# Patient Record
Sex: Female | Born: 1980 | Race: White | Hispanic: No | Marital: Married | Smoking: Never smoker
Health system: Southern US, Community
[De-identification: ages and names within clinical notes are randomized; demographics above are authoritative.]

---
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
## 2002-06-04 ENCOUNTER — Other Ambulatory Visit: Admission: RE | Admit: 2002-06-04 | Discharge: 2002-06-04 | Payer: Self-pay | Admitting: Obstetrics and Gynecology

## 2003-03-06 ENCOUNTER — Other Ambulatory Visit: Admission: RE | Admit: 2003-03-06 | Discharge: 2003-03-06 | Payer: Self-pay | Admitting: Obstetrics and Gynecology

## 2004-03-09 ENCOUNTER — Other Ambulatory Visit: Admission: RE | Admit: 2004-03-09 | Discharge: 2004-03-09 | Payer: Self-pay | Admitting: Obstetrics and Gynecology

## 2005-07-19 ENCOUNTER — Other Ambulatory Visit: Admission: RE | Admit: 2005-07-19 | Discharge: 2005-07-19 | Payer: Self-pay | Admitting: Obstetrics and Gynecology

## 2009-08-12 ENCOUNTER — Inpatient Hospital Stay (HOSPITAL_COMMUNITY): Admission: AD | Admit: 2009-08-12 | Discharge: 2009-08-14 | Payer: Self-pay | Admitting: Obstetrics and Gynecology

## 2010-10-01 LAB — CBC
HCT: 32.8 % — ABNORMAL LOW (ref 36.0–46.0)
HCT: 39.6 % (ref 36.0–46.0)
Hemoglobin: 11.2 g/dL — ABNORMAL LOW (ref 12.0–15.0)
MCHC: 34.3 g/dL (ref 30.0–36.0)
MCV: 94.6 fL (ref 78.0–100.0)
RBC: 4.19 MIL/uL (ref 3.87–5.11)
RDW: 12.8 % (ref 11.5–15.5)
WBC: 10.8 10*3/uL — ABNORMAL HIGH (ref 4.0–10.5)

## 2011-02-26 ENCOUNTER — Ambulatory Visit
Admission: RE | Admit: 2011-02-26 | Discharge: 2011-02-26 | Disposition: A | Discharge status: Home / Self Care | Payer: BC Managed Care – PPO | Source: Ambulatory Visit | Attending: Rheumatology | Admitting: Rheumatology

## 2011-02-26 ENCOUNTER — Other Ambulatory Visit: Payer: Self-pay | Admitting: Rheumatology

## 2011-02-26 DIAGNOSIS — M25561 Pain in right knee: Secondary | ICD-10-CM

## 2012-02-08 ENCOUNTER — Ambulatory Visit (INDEPENDENT_AMBULATORY_CARE_PROVIDER_SITE_OTHER): Payer: BC Managed Care – PPO | Admitting: Internal Medicine

## 2012-02-08 VITALS — BP 132/88 | HR 125 | Temp 99.3°F | Resp 20 | Ht 65.0 in | Wt 142.4 lb

## 2012-02-08 DIAGNOSIS — R21 Rash and other nonspecific skin eruption: Secondary | ICD-10-CM

## 2012-02-08 DIAGNOSIS — R112 Nausea with vomiting, unspecified: Secondary | ICD-10-CM

## 2012-02-08 DIAGNOSIS — R509 Fever, unspecified: Secondary | ICD-10-CM

## 2012-02-08 LAB — POCT UA - MICROSCOPIC ONLY
Bacteria, U Microscopic: NEGATIVE
Casts, Ur, LPF, POC: NEGATIVE
Crystals, Ur, HPF, POC: NEGATIVE
Yeast, UA: NEGATIVE

## 2012-02-08 LAB — POCT URINALYSIS DIPSTICK
Bilirubin, UA: NEGATIVE
Blood, UA: NEGATIVE
Glucose, UA: NEGATIVE
Ketones, UA: NEGATIVE
Leukocytes, UA: NEGATIVE
Nitrite, UA: NEGATIVE
Protein, UA: 100
Spec Grav, UA: >=1.03
Urobilinogen, UA: 0.2
pH, UA: 5.5

## 2012-02-08 LAB — POCT CBC
Granulocyte percent: 87.7 %G — AB (ref 37–80)
HCT, POC: 49.8 % — AB (ref 37.7–47.9)
Hemoglobin: 16 g/dL (ref 12.2–16.2)
Lymph, poc: 1.6 (ref 0.6–3.4)
MCH, POC: 30.7 pg (ref 27–31.2)
MCHC: 32.1 g/dL (ref 31.8–35.4)
MCV: 95.5 fL (ref 80–97)
MID (cbc): 0.5 (ref 0–0.9)
MPV: 10.4 fL (ref 0–99.8)
POC Granulocyte: 14.8 — AB (ref 2–6.9)
POC LYMPH PERCENT: 9.2 %L — AB (ref 10–50)
POC MID %: 3.1 %M (ref 0–12)
Platelet Count, POC: 311 10*3/uL (ref 142–424)
RBC: 5.21 M/uL (ref 4.04–5.48)
RDW, POC: 13.5 %
WBC: 16.9 10*3/uL — AB (ref 4.6–10.2)

## 2012-02-08 MED ORDER — METHYLPREDNISOLONE ACETATE 80 MG/ML IJ SUSP
80.0000 mg | Freq: Once | INTRAMUSCULAR | Status: AC
  Administered 2012-02-08: 80 mg via INTRAMUSCULAR

## 2012-02-08 MED ORDER — PREDNISONE 20 MG PO TABS: ORAL_TABLET | ORAL | Status: AC

## 2012-02-08 NOTE — Progress Notes (Signed)
  Subjective:    Patient ID: Cynthia Cunningham, female    DOB: [birth year], 31 y.o.   MRN: [id number]  HPI 31 year old female presents with 2 day history of pruritic rash. First noticed after taking Macrodantin which she re-started for UTI prophylaxis. She took first dose last night. Had pruritic rash erupt on her legs, arms, and face.  She also took Nyquil due to slight nasal congestion. Awoke last night with nausea, vomiting, and diarrhea.  Last episode of emesis at 5:30 a.m today. Says rash and all other symptoms were improving today until she took the second dose of macrodantin.  She then had severely pruritic rash with another episode of diarrhea. Took benadryl at 3:30 p.m today which has helped with the pruritis. Denies SOB, lip/tongue swelling, oral lesions, wheezing, cough, sore throat, otalgia, headache, dysuria, or urinary frequency.      Review of Systems  All other systems reviewed and are negative.       Objective:   Physical Exam  Constitutional: She is oriented to person, place, and time. She appears well-developed and well-nourished.  HENT:  Head: Normocephalic and atraumatic.  Right Ear: External ear normal.  Left Ear: External ear normal.  Mouth/Throat: Uvula is midline, oropharynx is clear and moist and mucous membranes are normal. No oropharyngeal exudate.  Eyes: Conjunctivae are normal.  Neck: Normal range of motion.  Cardiovascular: Normal heart sounds.  Tachycardia present.   Pulmonary/Chest: Effort normal and breath sounds normal.  Lymphadenopathy:    She has no cervical adenopathy.  Neurological: She is alert and oriented to person, place, and time.  Skin:       Diffuse, erythematous rash over bilateral arms and legs.    Psychiatric: She has a normal mood and affect. Her behavior is normal. Judgment and thought content normal.     Zyrtec 10 mg and Zantac 150 mg given today in office.  Seen with Dr. Perrin Maltese    Assessment & Plan:   1. Rash    2. Nausea with  vomiting  Comprehensive metabolic panel, POCT urinalysis dipstick, POCT UA - Microscopic Only  3. Fever  POCT CBC, Comprehensive metabolic panel   Follow up in 24 hours to recheck CBC and ensure improvement of rash.   Discontinue macrodantin.

## 2012-02-09 ENCOUNTER — Ambulatory Visit (INDEPENDENT_AMBULATORY_CARE_PROVIDER_SITE_OTHER): Payer: BC Managed Care – PPO | Admitting: Emergency Medicine

## 2012-02-09 VITALS — BP 123/70 | HR 96 | Temp 98.9°F | Resp 16 | Ht 65.0 in | Wt 143.0 lb

## 2012-02-09 DIAGNOSIS — R509 Fever, unspecified: Secondary | ICD-10-CM

## 2012-02-09 DIAGNOSIS — D72829 Elevated white blood cell count, unspecified: Secondary | ICD-10-CM

## 2012-02-09 DIAGNOSIS — R300 Dysuria: Secondary | ICD-10-CM

## 2012-02-09 LAB — POCT CBC
Granulocyte percent: 85.3 %G — AB (ref 37–80)
HCT, POC: 44.7 % (ref 37.7–47.9)
Hemoglobin: 14 g/dL (ref 12.2–16.2)
Lymph, poc: 1.1 (ref 0.6–3.4)
MCH, POC: 29.7 pg (ref 27–31.2)
MCHC: 31.3 g/dL — AB (ref 31.8–35.4)
MCV: 94.7 fL (ref 80–97)
MID (cbc): 0.1 (ref 0–0.9)
MPV: 9.8 fL (ref 0–99.8)
POC Granulocyte: 7.3 — AB (ref 2–6.9)
POC LYMPH PERCENT: 13.1 %L (ref 10–50)
POC MID %: 1.6 %M (ref 0–12)
Platelet Count, POC: 277 10*3/uL (ref 142–424)
RBC: 4.72 M/uL (ref 4.04–5.48)
RDW, POC: 13 %
WBC: 8.6 10*3/uL (ref 4.6–10.2)

## 2012-02-09 LAB — COMPREHENSIVE METABOLIC PANEL
ALT: 19 U/L (ref 0–35)
AST: 20 U/L (ref 0–37)
Albumin: 4.6 g/dL (ref 3.5–5.2)
Alkaline Phosphatase: 33 U/L — ABNORMAL LOW (ref 39–117)
BUN: 14 mg/dL (ref 6–23)
CO2: 22 mEq/L (ref 19–32)
Calcium: 10 mg/dL (ref 8.4–10.5)
Chloride: 103 mEq/L (ref 96–112)
Creat: 1.1 mg/dL (ref 0.50–1.10)
Glucose, Bld: 123 mg/dL — ABNORMAL HIGH (ref 70–99)
Potassium: 4.1 mEq/L (ref 3.5–5.3)
Sodium: 136 mEq/L (ref 135–145)
Total Bilirubin: 1.3 mg/dL — ABNORMAL HIGH (ref 0.3–1.2)
Total Protein: 6.7 g/dL (ref 6.0–8.3)

## 2012-02-09 NOTE — Progress Notes (Signed)
  Subjective:    Patient ID: Cynthia Cunningham, female    DOB: 1980/11/12, 31 y.o.   MRN: [id number]  HPI Patient presents for follow up of pruritic rash and fever.  She says the itching and rash have improved. She has minimal pruritis today. Still complains of achy hands and wrists. Her wrists were swollen this morning but have improved after dose of steroids this morning. Does state she noticed a painless oral lesion last night around 9 pm.  Fever is resolved today and she has not had any more nausea, vomiting, or diarrhea.  Says she feels much better today compared to yesterday.      Review of Systems  All other systems reviewed and are negative.       Objective:   Physical Exam  Vitals reviewed. Constitutional: She is oriented to person, place, and time. She appears well-developed and well-nourished.  HENT:  Head: Normocephalic and atraumatic.  Right Ear: External ear normal.  Left Ear: External ear normal.  Mouth/Throat: No oropharyngeal exudate.       Single, solitary oral ulcer  Eyes: Conjunctivae are normal.  Neck: Normal range of motion.  Cardiovascular: Normal rate, regular rhythm and normal heart sounds.   Pulmonary/Chest: Effort normal and breath sounds normal.  Lymphadenopathy:    She has no cervical adenopathy.  Neurological: She is alert and oriented to person, place, and time.  Psychiatric: She has a normal mood and affect. Her behavior is normal. Judgment and thought content normal.     Seen with Dr. Cleta Cunningham     Assessment & Plan:   1. Dysuria  Urine culture  2. Fever    3. Leukocytosis  POCT CBC   Much improved from yesterday.  Continue prednisone and zyrtec as directed. Follow up if symptoms worsen or fail to improve.  Contact GYN and let them know that she now has an allergy to macrodantin and should no longer take this for UTI prophylaxis. Will await urine culture to decide if antibiotic treatment is needed.

## 2012-02-11 LAB — URINE CULTURE
Colony Count: NO GROWTH
Organism ID, Bacteria: NO GROWTH

## 2013-07-05 IMAGING — CR DG KNEE 1-2V*R*
2 series · 2 of 2 positions shown · non-contrast
Comparison: None.

CLINICAL DATA: Knee pain, no injury

RIGHT KNEE - 1-2 VIEW

[view not recorded (1 of 2)]
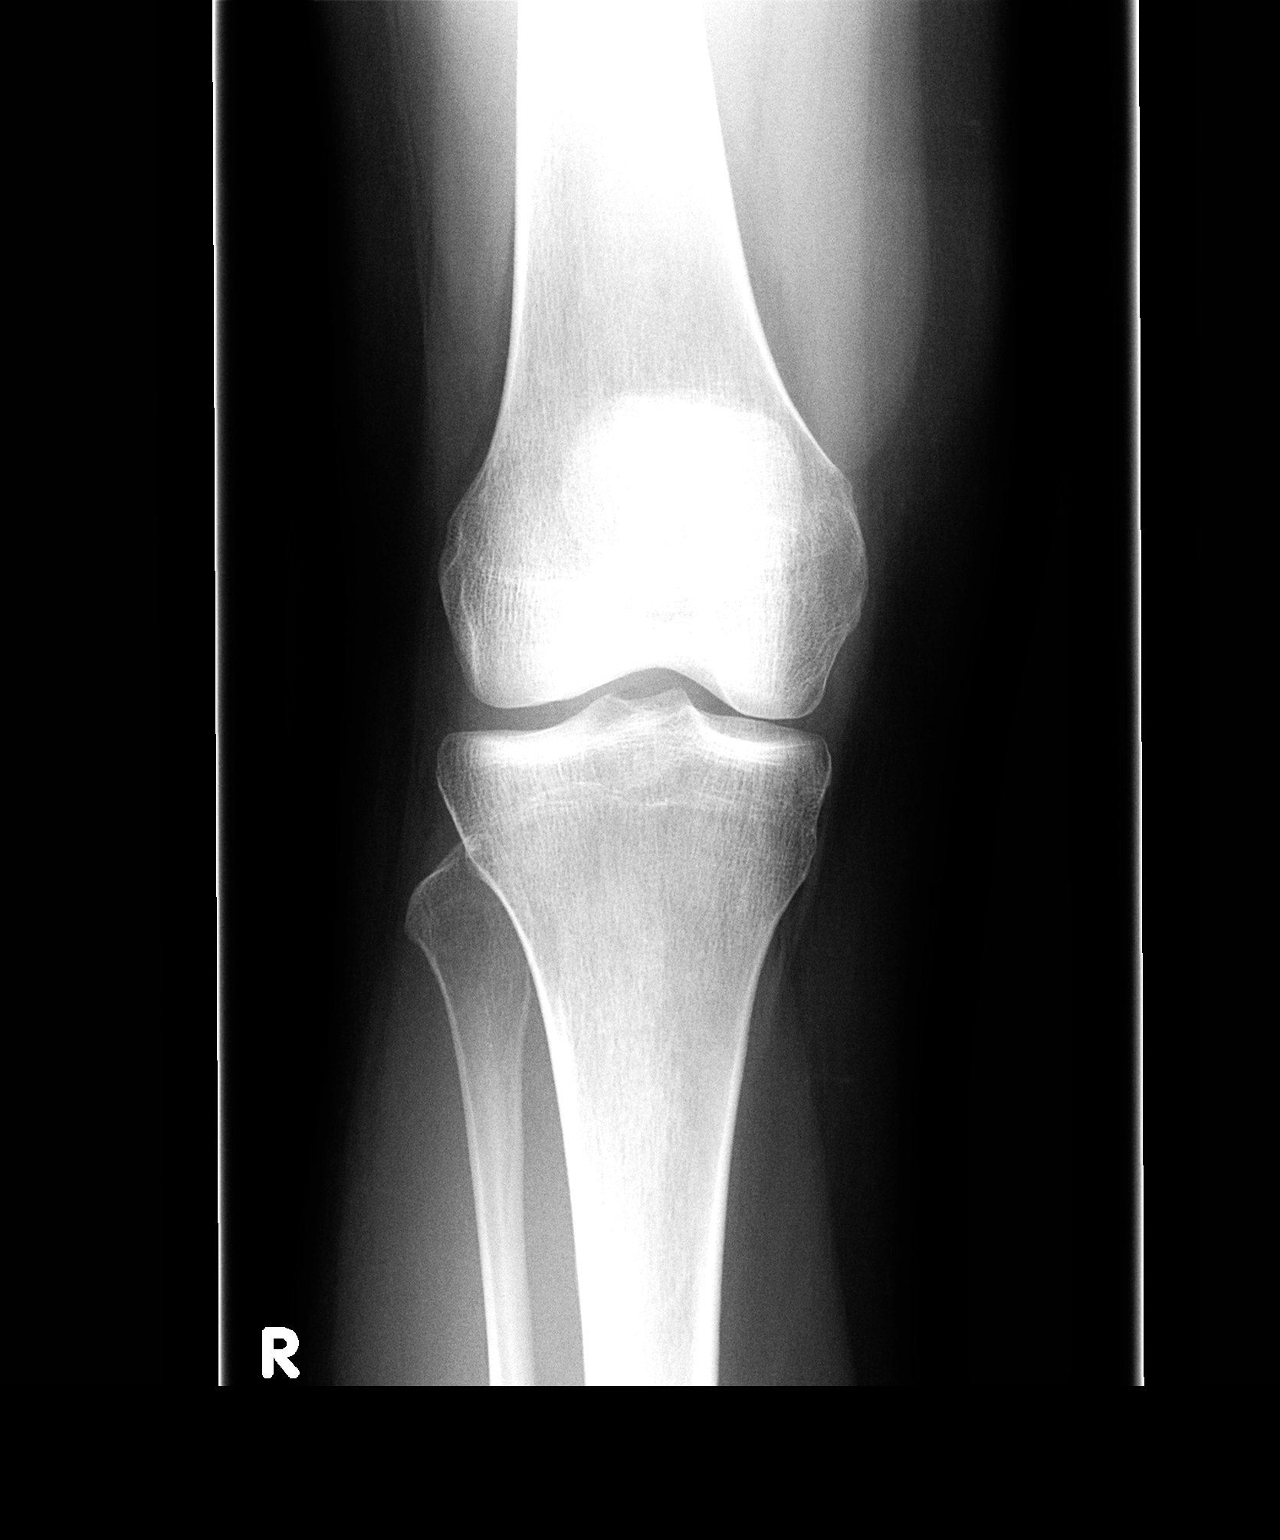

[view not recorded (2 of 2)]
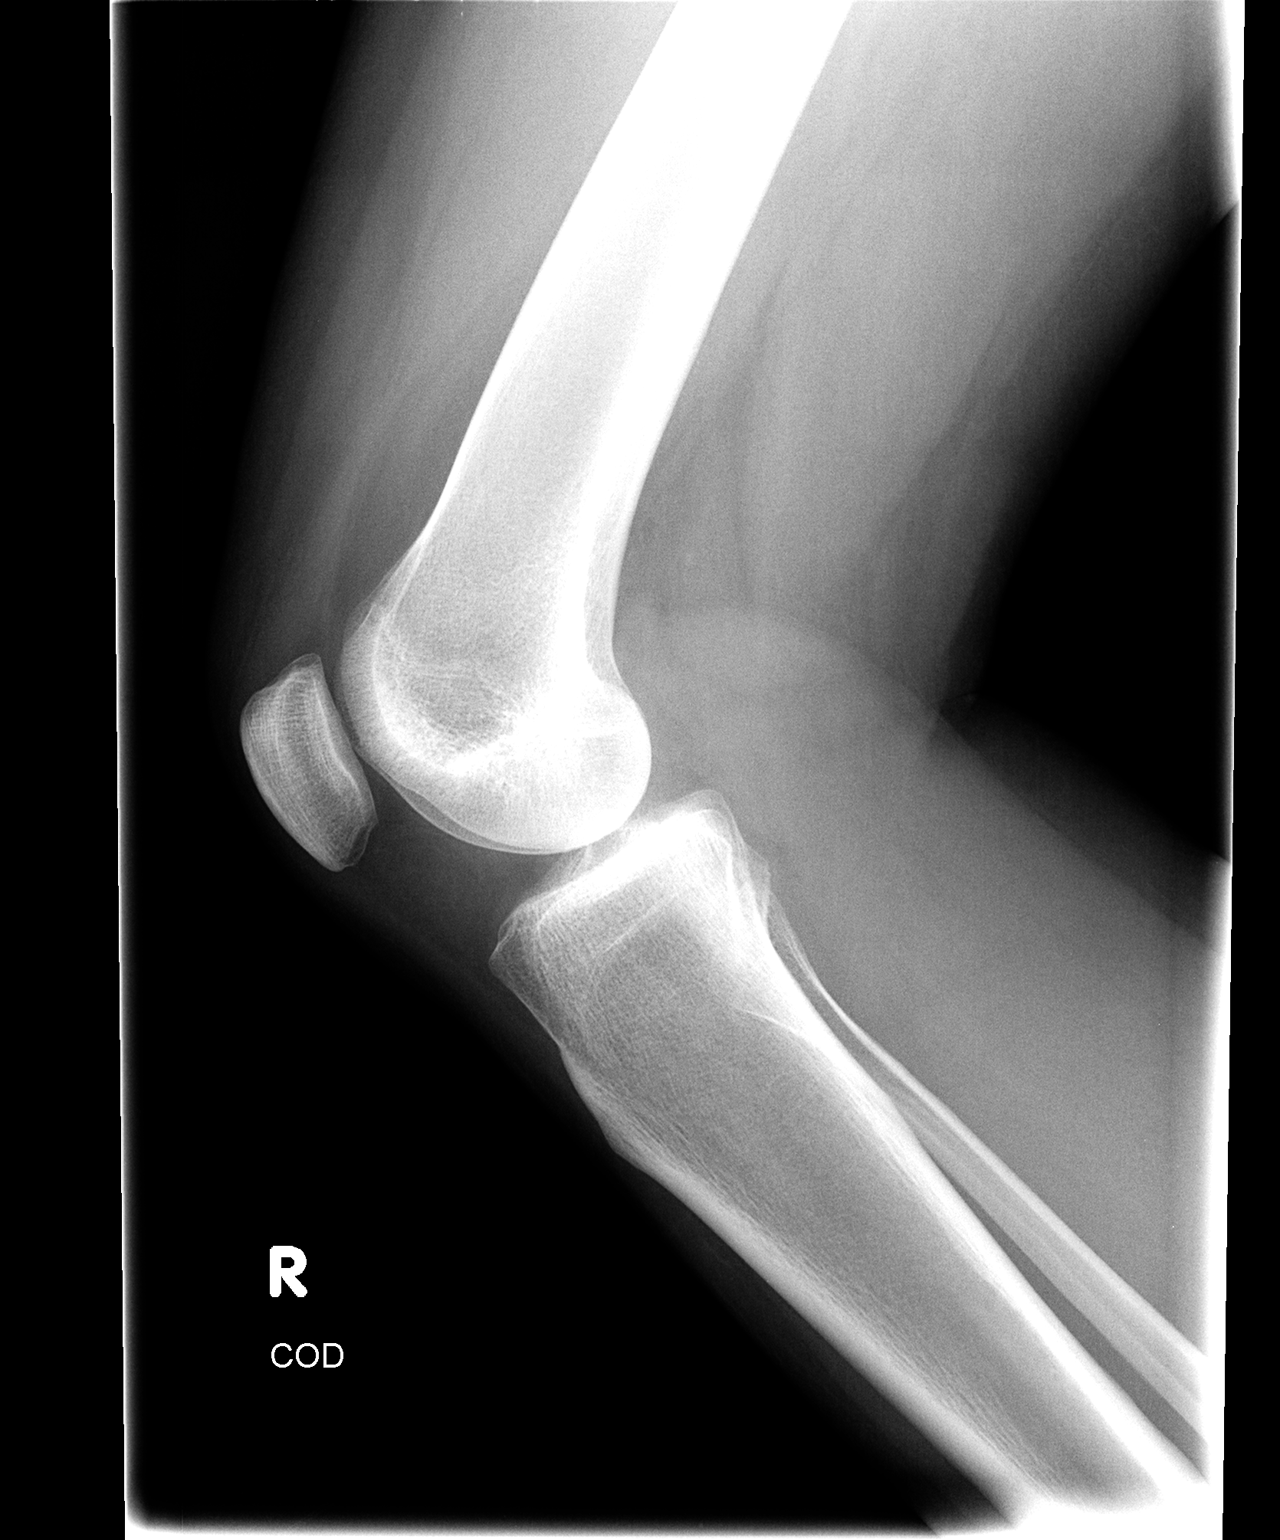

[2 of 2 positions shown; findings below may reference images not displayed]

FINDINGS: Two views of the right knee show relatively normal joint
spaces.  No fracture is seen.  No effusion is noted.
IMPRESSION: Negative right knee.

## 2014-01-14 ENCOUNTER — Ambulatory Visit (INDEPENDENT_AMBULATORY_CARE_PROVIDER_SITE_OTHER): Payer: BC Managed Care – PPO | Admitting: Family Medicine

## 2014-01-14 VITALS — BP 127/81 | HR 71 | Temp 98.2°F | Resp 14 | Ht 65.75 in | Wt 150.4 lb

## 2014-01-14 DIAGNOSIS — R42 Dizziness and giddiness: Secondary | ICD-10-CM

## 2014-01-14 DIAGNOSIS — R110 Nausea: Secondary | ICD-10-CM

## 2014-01-14 LAB — GLUCOSE, POCT (MANUAL RESULT ENTRY): POC Glucose: 109 mg/dl — AB (ref 70–99)

## 2014-01-14 NOTE — Patient Instructions (Addendum)
Drink lots of fluids  If you have further episodes such as this will need to do additional evaluation.

## 2014-01-14 NOTE — Progress Notes (Signed)
Subjective: [age] year old lady who was at work this morning at about 11:00 when she developed, felt weak, was a little clammy,. If she sat there for about 10 minutes or so before laying down. She went to the bathroom and lay down with her feet up. She did not actually pass out. She got herself up and someone else came in the bathroom. She did not vomit. She felt shaky. It took about an hour for her symptoms to resolve completely. She got up in a routine fashion this morning, had some pop hurts at around 1-1/2 hours before this. She had coffee. She has not had a lot of liquids.  She has a history of some recurrences of urinary tract infections and takes prophylactic cephalexin prior to intercourse. She's on contraception pills. No other regular medications. She had one glass of wine last night. She does not smoke. Just finished her menstrual cycle.  Objective: Pleasant alert young lady who says she feels okay now and would not come in if her spouse had not urged her. TMs are normal. Eyes PERRLA. EOMs intact. Fundi benign. Throat clear. Neck supple. Chest clear. Heart regular without murmurs. Moves all extremities. Negative Romberg. We'll alert and oriented.  Assessment: Presyncopal episode, with dizziness and nausea and sweating, etiology undetermined  Plan: Orthostatic vital signs Glucose  Results for orders placed in visit on 01/14/14  GLUCOSE, POCT (MANUAL RESULT ENTRY)      Result Value Ref Range   POC Glucose 109 (*) 70 - 99 mg/dl   No clear etiology, but I do not feel that additional workup is indicated at this time. If she has further episodes she is to return.

## 2019-04-23 ENCOUNTER — Other Ambulatory Visit: Payer: Self-pay

## 2019-04-23 DIAGNOSIS — Z20822 Contact with and (suspected) exposure to covid-19: Secondary | ICD-10-CM

## 2019-04-24 LAB — NOVEL CORONAVIRUS, NAA: SARS-CoV-2, NAA: NOT DETECTED

## 2019-08-01 ENCOUNTER — Other Ambulatory Visit: Payer: Self-pay

## 2019-08-01 ENCOUNTER — Ambulatory Visit: Payer: BC Managed Care – PPO | Attending: Internal Medicine

## 2019-08-01 DIAGNOSIS — Z20822 Contact with and (suspected) exposure to covid-19: Secondary | ICD-10-CM

## 2019-08-02 LAB — NOVEL CORONAVIRUS, NAA: SARS-CoV-2, NAA: DETECTED — AB

## 2019-10-05 ENCOUNTER — Ambulatory Visit: Payer: BC Managed Care – PPO | Attending: Internal Medicine

## 2019-10-05 DIAGNOSIS — Z23 Encounter for immunization: Secondary | ICD-10-CM

## 2019-10-05 NOTE — Progress Notes (Signed)
   Covid-19 Vaccination [facility]  Name:  MICHOL EMORY    MRN: [id number] DOB: December 11, 1980  10/05/2019  Ms. Sweaney was observed post Covid-19 immunization for 15 minutes without incident. She was provided with Vaccine Information Sheet and instruction to access the V-Safe system.   Ms. Mchaney was instructed to call 911 with any severe reactions post vaccine: Marland Kitchen Difficulty breathing  . Swelling of face and throat  . A fast heartbeat  . A bad rash all over body  . Dizziness and weakness   Immunizations Administered    Name Date Dose VIS Date Route   Pfizer COVID-19 Vaccine 10/05/2019 10:54 AM 0.3 mL 06/22/2019 Intramuscular   Manufacturer: [employer], [employer]   Lot: [id number]   NDC: [address]-5

## 2019-10-29 ENCOUNTER — Ambulatory Visit: Payer: BC Managed Care – PPO | Attending: Internal Medicine

## 2019-10-29 DIAGNOSIS — Z23 Encounter for immunization: Secondary | ICD-10-CM

## 2019-10-29 NOTE — Progress Notes (Signed)
   Covid-19 Vaccination Clinic  Name:  Cynthia Cunningham    MRN: [id number] DOB: 1980-09-08  10/29/2019  Ms. Cynthia Cunningham was observed post Covid-19 immunization for 15 minutes without incident. She was provided with Vaccine Information Sheet and instruction to access the V-Safe system.   Ms. Cynthia Cunningham was instructed to call 911 with any severe reactions post vaccine: Marland Kitchen Difficulty breathing  . Swelling of face and throat  . A fast heartbeat  . A bad rash all over body  . Dizziness and weakness   Immunizations Administered    Name Date Dose VIS Date Route   Pfizer COVID-19 Vaccine 10/29/2019  3:37 PM 0.3 mL 09/05/2018 Intramuscular   Manufacturer: [employer], [employer]   Lot: [id number]   NDC: [address]-8

## 2021-12-15 ENCOUNTER — Other Ambulatory Visit: Payer: Self-pay | Admitting: Physician Assistant

## 2021-12-15 DIAGNOSIS — K118 Other diseases of salivary glands: Secondary | ICD-10-CM

## 2022-01-07 ENCOUNTER — Ambulatory Visit
Admission: RE | Admit: 2022-01-07 | Discharge: 2022-01-07 | Disposition: A | Discharge status: Home / Self Care | Payer: BC Managed Care – PPO | Source: Ambulatory Visit | Attending: Physician Assistant | Admitting: Physician Assistant

## 2022-01-07 DIAGNOSIS — K118 Other diseases of salivary glands: Secondary | ICD-10-CM

## 2022-01-07 MED ORDER — IOPAMIDOL (ISOVUE-300) INJECTION 61%
75.0000 mL | Freq: Once | INTRAVENOUS | Status: AC | PRN
  Administered 2022-01-07: 75 mL via INTRAVENOUS

## 2024-03-16 ENCOUNTER — Other Ambulatory Visit: Payer: Self-pay | Admitting: Obstetrics and Gynecology

## 2024-03-16 DIAGNOSIS — N6489 Other specified disorders of breast: Secondary | ICD-10-CM

## 2024-03-22 ENCOUNTER — Other Ambulatory Visit: Payer: Self-pay | Admitting: Obstetrics and Gynecology

## 2024-03-22 ENCOUNTER — Ambulatory Visit
Admission: RE | Admit: 2024-03-22 | Discharge: 2024-03-22 | Disposition: A | Discharge status: Home / Self Care | Source: Ambulatory Visit | Attending: Obstetrics and Gynecology | Admitting: Obstetrics and Gynecology

## 2024-03-22 DIAGNOSIS — N6489 Other specified disorders of breast: Secondary | ICD-10-CM

## 2024-03-22 DIAGNOSIS — D241 Benign neoplasm of right breast: Secondary | ICD-10-CM
